# Patient Record
Sex: Male | Born: 1955 | Race: Black or African American | Hispanic: No
Health system: Southern US, Community
[De-identification: ages and names within clinical notes are randomized; demographics above are authoritative.]

## PROBLEM LIST (undated history)

## (undated) DIAGNOSIS — C801 Malignant (primary) neoplasm, unspecified: Secondary | ICD-10-CM

---

## 1998-11-02 ENCOUNTER — Ambulatory Visit (HOSPITAL_COMMUNITY): Admission: RE | Admit: 1998-11-02 | Discharge: 1998-11-02 | Payer: Self-pay | Admitting: Family Medicine

## 1998-11-02 ENCOUNTER — Encounter: Payer: Self-pay | Admitting: Family Medicine

## 2016-05-02 ENCOUNTER — Inpatient Hospital Stay (HOSPITAL_COMMUNITY)
Admission: EM | Admit: 2016-05-02 | Discharge: 2016-05-04 | DRG: 871 | Disposition: A | Discharge status: Hospice | Payer: Non-veteran care | Attending: Oncology | Admitting: Oncology

## 2016-05-02 ENCOUNTER — Emergency Department (HOSPITAL_COMMUNITY): Payer: Non-veteran care

## 2016-05-02 ENCOUNTER — Encounter (HOSPITAL_COMMUNITY): Payer: Self-pay | Admitting: Emergency Medicine

## 2016-05-02 DIAGNOSIS — Z91018 Allergy to other foods: Secondary | ICD-10-CM

## 2016-05-02 DIAGNOSIS — A419 Sepsis, unspecified organism: Secondary | ICD-10-CM | POA: Diagnosis present

## 2016-05-02 DIAGNOSIS — Z79891 Long term (current) use of opiate analgesic: Secondary | ICD-10-CM | POA: Diagnosis not present

## 2016-05-02 DIAGNOSIS — Z515 Encounter for palliative care: Secondary | ICD-10-CM | POA: Diagnosis present

## 2016-05-02 DIAGNOSIS — C7970 Secondary malignant neoplasm of unspecified adrenal gland: Secondary | ICD-10-CM | POA: Diagnosis present

## 2016-05-02 DIAGNOSIS — E279 Disorder of adrenal gland, unspecified: Secondary | ICD-10-CM | POA: Diagnosis present

## 2016-05-02 DIAGNOSIS — Z9221 Personal history of antineoplastic chemotherapy: Secondary | ICD-10-CM

## 2016-05-02 DIAGNOSIS — G936 Cerebral edema: Secondary | ICD-10-CM | POA: Diagnosis present

## 2016-05-02 DIAGNOSIS — J9621 Acute and chronic respiratory failure with hypoxia: Secondary | ICD-10-CM | POA: Diagnosis present

## 2016-05-02 DIAGNOSIS — C7971 Secondary malignant neoplasm of right adrenal gland: Secondary | ICD-10-CM

## 2016-05-02 DIAGNOSIS — J90 Pleural effusion, not elsewhere classified: Secondary | ICD-10-CM | POA: Diagnosis present

## 2016-05-02 DIAGNOSIS — J9811 Atelectasis: Secondary | ICD-10-CM | POA: Diagnosis present

## 2016-05-02 DIAGNOSIS — C3490 Malignant neoplasm of unspecified part of unspecified bronchus or lung: Secondary | ICD-10-CM

## 2016-05-02 DIAGNOSIS — R402362 Coma scale, best motor response, obeys commands, at arrival to emergency department: Secondary | ICD-10-CM | POA: Diagnosis present

## 2016-05-02 DIAGNOSIS — J189 Pneumonia, unspecified organism: Secondary | ICD-10-CM | POA: Diagnosis present

## 2016-05-02 DIAGNOSIS — G9340 Encephalopathy, unspecified: Secondary | ICD-10-CM | POA: Diagnosis present

## 2016-05-02 DIAGNOSIS — I10 Essential (primary) hypertension: Secondary | ICD-10-CM | POA: Diagnosis present

## 2016-05-02 DIAGNOSIS — R590 Localized enlarged lymph nodes: Secondary | ICD-10-CM

## 2016-05-02 DIAGNOSIS — R400 Somnolence: Secondary | ICD-10-CM

## 2016-05-02 DIAGNOSIS — Z9981 Dependence on supplemental oxygen: Secondary | ICD-10-CM

## 2016-05-02 DIAGNOSIS — C7931 Secondary malignant neoplasm of brain: Secondary | ICD-10-CM | POA: Diagnosis present

## 2016-05-02 DIAGNOSIS — C342 Malignant neoplasm of middle lobe, bronchus or lung: Secondary | ICD-10-CM

## 2016-05-02 DIAGNOSIS — G911 Obstructive hydrocephalus: Secondary | ICD-10-CM | POA: Diagnosis present

## 2016-05-02 DIAGNOSIS — C7972 Secondary malignant neoplasm of left adrenal gland: Secondary | ICD-10-CM

## 2016-05-02 DIAGNOSIS — M545 Low back pain: Secondary | ICD-10-CM | POA: Diagnosis not present

## 2016-05-02 DIAGNOSIS — R402132 Coma scale, eyes open, to sound, at arrival to emergency department: Secondary | ICD-10-CM | POA: Diagnosis present

## 2016-05-02 DIAGNOSIS — E119 Type 2 diabetes mellitus without complications: Secondary | ICD-10-CM

## 2016-05-02 DIAGNOSIS — R402252 Coma scale, best verbal response, oriented, at arrival to emergency department: Secondary | ICD-10-CM | POA: Diagnosis present

## 2016-05-02 DIAGNOSIS — G919 Hydrocephalus, unspecified: Secondary | ICD-10-CM

## 2016-05-02 DIAGNOSIS — D72829 Elevated white blood cell count, unspecified: Secondary | ICD-10-CM

## 2016-05-02 DIAGNOSIS — Z833 Family history of diabetes mellitus: Secondary | ICD-10-CM

## 2016-05-02 DIAGNOSIS — C3411 Malignant neoplasm of upper lobe, right bronchus or lung: Secondary | ICD-10-CM

## 2016-05-02 DIAGNOSIS — J440 Chronic obstructive pulmonary disease with acute lower respiratory infection: Secondary | ICD-10-CM | POA: Diagnosis present

## 2016-05-02 DIAGNOSIS — Z87891 Personal history of nicotine dependence: Secondary | ICD-10-CM | POA: Diagnosis not present

## 2016-05-02 DIAGNOSIS — Z923 Personal history of irradiation: Secondary | ICD-10-CM

## 2016-05-02 DIAGNOSIS — Z79899 Other long term (current) drug therapy: Secondary | ICD-10-CM | POA: Diagnosis not present

## 2016-05-02 DIAGNOSIS — J9620 Acute and chronic respiratory failure, unspecified whether with hypoxia or hypercapnia: Secondary | ICD-10-CM

## 2016-05-02 DIAGNOSIS — R0902 Hypoxemia: Secondary | ICD-10-CM

## 2016-05-02 HISTORY — DX: Malignant (primary) neoplasm, unspecified: C80.1

## 2016-05-02 LAB — COMPREHENSIVE METABOLIC PANEL
ALT: 22 U/L (ref 17–63)
AST: 41 U/L (ref 15–41)
Albumin: 3.8 g/dL (ref 3.5–5.0)
Alkaline Phosphatase: 66 U/L (ref 38–126)
Anion gap: 9 (ref 5–15)
BUN: 9 mg/dL (ref 6–20)
CHLORIDE: 99 mmol/L — AB (ref 101–111)
CO2: 32 mmol/L (ref 22–32)
CREATININE: 0.68 mg/dL (ref 0.61–1.24)
Calcium: 9.5 mg/dL (ref 8.9–10.3)
Glucose, Bld: 142 mg/dL — ABNORMAL HIGH (ref 65–99)
Potassium: 3.7 mmol/L (ref 3.5–5.1)
Sodium: 140 mmol/L (ref 135–145)
Total Bilirubin: 1 mg/dL (ref 0.3–1.2)
Total Protein: 7.7 g/dL (ref 6.5–8.1)

## 2016-05-02 LAB — GLUCOSE, CAPILLARY: Glucose-Capillary: 157 mg/dL — ABNORMAL HIGH (ref 65–99)

## 2016-05-02 LAB — I-STAT CG4 LACTIC ACID, ED
LACTIC ACID, VENOUS: 1.36 mmol/L (ref 0.5–1.9)
LACTIC ACID, VENOUS: 0.89 mmol/L (ref 0.5–1.9)

## 2016-05-02 LAB — CBC WITH DIFFERENTIAL/PLATELET
BASOS PCT: 0 %
Basophils Absolute: 0 10*3/uL (ref 0.0–0.1)
EOS PCT: 0 %
Eosinophils Absolute: 0 10*3/uL (ref 0.0–0.7)
HCT: 36.8 % — ABNORMAL LOW (ref 39.0–52.0)
Hemoglobin: 11.7 g/dL — ABNORMAL LOW (ref 13.0–17.0)
LYMPHS ABS: 1.4 10*3/uL (ref 0.7–4.0)
Lymphocytes Relative: 13 %
MCH: 22 pg — AB (ref 26.0–34.0)
MCHC: 31.8 g/dL (ref 30.0–36.0)
MCV: 69 fL — AB (ref 78.0–100.0)
Monocytes Absolute: 0.7 10*3/uL (ref 0.1–1.0)
Monocytes Relative: 6 %
NEUTROS ABS: 8.9 10*3/uL — AB (ref 1.7–7.7)
Neutrophils Relative %: 81 %
PLATELETS: 267 10*3/uL (ref 150–400)
RBC: 5.33 MIL/uL (ref 4.22–5.81)
RDW: 21.2 % — AB (ref 11.5–15.5)
WBC: 11 10*3/uL — ABNORMAL HIGH (ref 4.0–10.5)

## 2016-05-02 LAB — I-STAT TROPONIN, ED
Troponin i, poc: 0.02 ng/mL (ref 0.00–0.08)
Troponin i, poc: 0.03 ng/mL (ref 0.00–0.08)

## 2016-05-02 LAB — PROTIME-INR
INR: 1.11
Prothrombin Time: 14.4 seconds (ref 11.4–15.2)

## 2016-05-02 LAB — BRAIN NATRIURETIC PEPTIDE: B NATRIURETIC PEPTIDE 5: 320.9 pg/mL — AB (ref 0.0–100.0)

## 2016-05-02 LAB — LIPASE, BLOOD: LIPASE: 12 U/L (ref 11–51)

## 2016-05-02 MED ORDER — SENNOSIDES-DOCUSATE SODIUM 8.6-50 MG PO TABS
1.0000 | ORAL_TABLET | Freq: Every day | ORAL | Status: DC
  Administered 2016-05-02: 1 via ORAL
  Filled 2016-05-02: qty 1

## 2016-05-02 MED ORDER — ACETAMINOPHEN 325 MG PO TABS
650.0000 mg | ORAL_TABLET | Freq: Four times a day (QID) | ORAL | Status: DC | PRN
  Filled 2016-05-02: qty 2

## 2016-05-02 MED ORDER — ALBUTEROL SULFATE (2.5 MG/3ML) 0.083% IN NEBU: 2.5000 mg | INHALATION_SOLUTION | RESPIRATORY_TRACT | Status: DC | PRN

## 2016-05-02 MED ORDER — MORPHINE SULFATE (PF) 4 MG/ML IV SOLN
4.0000 mg | INTRAVENOUS | Status: DC | PRN
  Administered 2016-05-03 – 2016-05-04 (×4): 4 mg via INTRAVENOUS
  Filled 2016-05-02 (×4): qty 1

## 2016-05-02 MED ORDER — SODIUM CHLORIDE 0.9% FLUSH
3.0000 mL | Freq: Two times a day (BID) | INTRAVENOUS | Status: DC
  Administered 2016-05-02 – 2016-05-04 (×4): 3 mL via INTRAVENOUS

## 2016-05-02 MED ORDER — CEFEPIME HCL 2 G IJ SOLR
2.0000 g | Freq: Once | INTRAMUSCULAR | Status: AC
  Administered 2016-05-02: 2 g via INTRAVENOUS
  Filled 2016-05-02: qty 2

## 2016-05-02 MED ORDER — SODIUM CHLORIDE 0.9% FLUSH
3.0000 mL | Freq: Two times a day (BID) | INTRAVENOUS | Status: DC
  Administered 2016-05-02 – 2016-05-04 (×3): 3 mL via INTRAVENOUS

## 2016-05-02 MED ORDER — PREDNISONE 50 MG PO TABS
60.0000 mg | ORAL_TABLET | Freq: Every day | ORAL | Status: DC
  Administered 2016-05-03: 60 mg via ORAL
  Filled 2016-05-02: qty 1

## 2016-05-02 MED ORDER — ACETAMINOPHEN 650 MG RE SUPP: 650.0000 mg | Freq: Four times a day (QID) | RECTAL | Status: DC | PRN

## 2016-05-02 MED ORDER — VANCOMYCIN HCL 500 MG IV SOLR
500.0000 mg | INTRAVENOUS | Status: AC
  Administered 2016-05-02: 500 mg via INTRAVENOUS
  Filled 2016-05-02: qty 500

## 2016-05-02 MED ORDER — IOPAMIDOL (ISOVUE-370) INJECTION 76%
INTRAVENOUS | Status: AC
  Administered 2016-05-02: 100 mL
  Filled 2016-05-02: qty 100

## 2016-05-02 MED ORDER — SODIUM CHLORIDE 0.9 % IV SOLN: 250.0000 mL | INTRAVENOUS | Status: DC | PRN

## 2016-05-02 MED ORDER — MAGNESIUM SULFATE 2 GM/50ML IV SOLN
2.0000 g | Freq: Once | INTRAVENOUS | Status: AC
  Administered 2016-05-02: 2 g via INTRAVENOUS
  Filled 2016-05-02: qty 50

## 2016-05-02 MED ORDER — VANCOMYCIN HCL IN DEXTROSE 1-5 GM/200ML-% IV SOLN
1000.0000 mg | Freq: Once | INTRAVENOUS | Status: AC
  Administered 2016-05-02: 1000 mg via INTRAVENOUS
  Filled 2016-05-02: qty 200

## 2016-05-02 MED ORDER — IPRATROPIUM-ALBUTEROL 0.5-2.5 (3) MG/3ML IN SOLN
3.0000 mL | Freq: Once | RESPIRATORY_TRACT | Status: AC
  Administered 2016-05-02: 3 mL via RESPIRATORY_TRACT
  Filled 2016-05-02: qty 3

## 2016-05-02 MED ORDER — DEXTROSE 5 % IV SOLN
1.0000 g | Freq: Three times a day (TID) | INTRAVENOUS | Status: DC
  Filled 2016-05-02: qty 1

## 2016-05-02 MED ORDER — METHYLPREDNISOLONE SODIUM SUCC 125 MG IJ SOLR
60.0000 mg | Freq: Once | INTRAMUSCULAR | Status: AC
  Administered 2016-05-02: 60 mg via INTRAVENOUS
  Filled 2016-05-02: qty 2

## 2016-05-02 MED ORDER — DEXTROSE 5 % IV SOLN
1.0000 g | Freq: Three times a day (TID) | INTRAVENOUS | Status: DC
  Administered 2016-05-02 – 2016-05-04 (×5): 1 g via INTRAVENOUS
  Filled 2016-05-02 (×7): qty 1

## 2016-05-02 MED ORDER — SODIUM CHLORIDE 0.9 % IV BOLUS (SEPSIS)
1000.0000 mL | Freq: Once | INTRAVENOUS | Status: AC
  Administered 2016-05-02: 1000 mL via INTRAVENOUS

## 2016-05-02 MED ORDER — VANCOMYCIN HCL IN DEXTROSE 1-5 GM/200ML-% IV SOLN
1000.0000 mg | Freq: Three times a day (TID) | INTRAVENOUS | Status: DC
  Filled 2016-05-02 (×2): qty 200

## 2016-05-02 MED ORDER — INSULIN ASPART 100 UNIT/ML ~~LOC~~ SOLN
0.0000 [IU] | Freq: Three times a day (TID) | SUBCUTANEOUS | Status: DC
  Administered 2016-05-03: 1 [IU] via SUBCUTANEOUS
  Administered 2016-05-03 (×2): 2 [IU] via SUBCUTANEOUS
  Administered 2016-05-04 (×2): 1 [IU] via SUBCUTANEOUS

## 2016-05-02 MED ORDER — ONDANSETRON HCL 4 MG/2ML IJ SOLN
4.0000 mg | Freq: Four times a day (QID) | INTRAMUSCULAR | Status: DC | PRN
  Administered 2016-05-03 – 2016-05-04 (×2): 4 mg via INTRAVENOUS
  Filled 2016-05-02 (×2): qty 2

## 2016-05-02 MED ORDER — ONDANSETRON HCL 4 MG PO TABS
4.0000 mg | ORAL_TABLET | Freq: Four times a day (QID) | ORAL | Status: DC | PRN
  Administered 2016-05-02: 4 mg via ORAL
  Filled 2016-05-02: qty 1

## 2016-05-02 MED ORDER — SODIUM CHLORIDE 0.9% FLUSH: 3.0000 mL | INTRAVENOUS | Status: DC | PRN

## 2016-05-02 MED ORDER — IPRATROPIUM-ALBUTEROL 0.5-2.5 (3) MG/3ML IN SOLN
3.0000 mL | RESPIRATORY_TRACT | Status: DC
  Administered 2016-05-02: 3 mL via RESPIRATORY_TRACT
  Filled 2016-05-02: qty 3

## 2016-05-02 NOTE — ED Notes (Signed)
[facility] contacted to activate Code Sepsis 

## 2016-05-02 NOTE — H&P (Signed)
Date: 05/02/2016               Patient Name:  Pedro Long MRN: [id number]  DOB: 02-Feb-1956 Age / Sex: 61 y.o., male   PCP: No primary care provider on file.         Medical Service: Internal Medicine Teaching Service         Attending Physician: Dr. Annia Belt, MD    First Contact: Dr. Jari Favre Pager: [phone]  Second Contact: Dr. Charlynn Grimes Pager: [phone]       After Hours (After 5p/  First Contact Pager: [phone]  weekends / holidays): Second Contact Pager: [phone]   Chief Complaint: shortness of breath, altered mental status.  History of Present Illness:  Pedro Long is a 61yo male with PMH of T2DM, HTN, COPD, small cell lung cancer with brain metastasis on chronic 4L [place] oxygen, presenting to the [facility] for shortness of breath and altered mental status.   Patient is unable to participate in meaningful interview at this time, so bulk of history was obtained from wife and son. Patient states he has no current complaints.  Family states that they noticed changes in mentation 2 days prior to admission, with confusion, somnolence, and unstable gait. Night prior to admission, he had developed subjective fevers and progressive increased work of breathing. He has a productive cough and chest pain with cough. He has had no vomiting, diarrhea, rashes, or other symptoms they have noticed, though EDP notes states he reported nausea and vomiting.   In the ED, patient was given duonebs, mag sulfate, solumedrol and was placed on bipap with symptomatic improvement. Prior to BiPAP he was on NRB saturating in mid 11s - mid 90s.  Patient is followed by the [facility]. Patient was recently diagnosed with stage 4 small cell lung cancer; he had a trial of chemotherapy, radiotherapy without improvement and with side effects so further therapy was held. Family is working on getting hospice at home. Wife and brother are decision makers. Wife is present in room; she states patient's goal is to go home with  hospice and she wants that for him which is why they are working on setting it up. However, she maintains Full Code. Per rest of family, patient has been back and forth on his code status but tends to want full code when he is acutely ill.   Meds:  Family did not bring in medicines or medicine list so unable to confirm. Lisinopril Morphine Oxycodone Anti-anxiety medicine Antiemetic   Allergies: Allergies as of 05/02/2016 - Review Complete 05/02/2016  Allergen Reaction Noted  . Pork-derived products  05/02/2016   Past Medical History:  Diagnosis Date  . Cancer [facility])     Family History: Family history of diabetes  Social History: Former smoker, previous heavy alcohol use, no illicit drug use.   Review of Systems: A complete ROS was negative except as per HPI.   Physical Exam: Blood pressure 162/93, pulse 108, resp. rate 26, height '5\' 11"'$  (1.803 m), weight 190 lb (86.2 kg), SpO2 97 %. General: alert, well-developed, and cooperative to examination.  Head: normocephalic and atraumatic.  Eyes: vision grossly intact, pupils equal, pupils round, pupils reactive to light, no injection and anicteric.  Mouth: unable to evaluate due to BiPAP.  Neck: supple, full ROM, no thyromegaly.  Lungs: normal respiratory effort, no accessory muscle use, course breath sounds throughout, no wheezing. Heart: normal rate, regular rhythm, no murmur, no gallop, and no rub.  Abdomen: soft,  non-tender, normal bowel sounds, no distention, no guarding, no rebound tenderness.  Msk: no joint swelling, no joint warmth, and no redness over joints.  Pulses: 2+ DP/PT pulses bilaterally Extremities: No cyanosis, clubbing, minimal LE edema Neurologic: alert & oriented X2, moves all extremities freely, follows some commands CN 2-12 grossly intact, strength normal in all extremities, sensation intact to light touch.  Skin: turgor normal and no rashes.  Psych: Oriented X2, not anxious appearing, and not depressed  appearing  LABS: WBC 11.0, Hgb 11.7, Hct 36.8, Plts 267 Na 140, K 3.7, Cl 99, CO2 32, BUN 9, Cr 0.68, Glu 142 LFT's unremarkable Lipase 12 BNP 320 PT/INR 14.4/1.106 iStat trop negative x2 LA neg x2  EKG: Personally reviewed - SR, biphasic t waves in V1-V2  CXR: Personally reviewed - severe right middle and lower lobe consolidation  CTA: No PE Advanced stage IV lung cancer extending into the mediastinum, encroachment of right main and right upper lobe pulm arteries. Occlusion of right middle lobe bronchi with subsequent consolidation. Mediastinal adenopathy, enlarged left supraclavicular lymph nodes, a left upper lobe nodule and left adrenal mass.   CT head: Mild to moderate obstructive hydrocephalus. Symmetric cerebellar edema.   Assessment & Plan by Problem: Principal Problem:   Sepsis ([place]) Active Problems:   Small cell lung cancer in adult [facility])   Brain metastases ([place])  Sepsis:  Patient with tachycardia, tachypnea, leukocytosis and encephalopathy in setting of possible pneumonia. Patient was started on vanc/cefepime empirically and received 1086m NS. --f/u BCx --f/u UA --f/u flu panel --f/u AM CBC  Encephalopathy: Patient with confusion, unstable gait and increased somnolence in last 48hrs. CT head showed mild to moderate hydrocephalus and cerebellar edema which could certainly be contributing to his encephalopathy. Other etiology likely is in setting of sepsis and infection.  --treatment of sepsis and pneumonia as below --solumedrol  Pneumonia: Patient symptoms and imaging concerning for pneumonia though hard to distinguish from large tumor burden.  --Cefepime  Acute on chronic respiratory failure: Patient with chronic oxygen requirement of 4L [place] at home in setting of advanced lung cancer and COPD. He had improvement symptomatically with BiPAP, solumedrol, mag sulfate and duonebs. --supplemental oxygen as needed; on home 4L [place]; BiPAP qhs and as  needed --solumedrol  --duonebs; albuterol PRN  Stage 4 small cell lung cancer, goals of care: Patient with advanced tumor burden now encroaching on pulmonary arteries. He has failed chemo radiation and goals are for home hospice. Patient is apparently undecided on code status; at this time he is altered so cannot reliably participate in such a conversation, though he did shake his head no to intubation. Wife and brother are decision makers. Wife is present in room and maintains FULL CODE status though acknowledging that she would eventually want patient to be at home with hospice. We discussed at length the low likelyhood of surviving code situation and if he did the low likelyhood of extubation and return home. We also discussed the repercussions of aggressive intervention especially in setting of unfavorable outcome. Family is in agreement with speaking with palliative care to help arrange and coordinate home hospice when the time is right (they already have some set up but per family is inadequate to completely take care of his needs). Family does not know pain medicine dosing at home; at admission patient seemed very comfortable and was not complaining of pain, in addition was not having air hunger at the time. --consult palliative care for hospice options and coordination, continued goals of  care discussion, and symptomatic treatment --we will also continue the discussion about goals of care with family --mophine '4mg'$  q4hr PRN; will adjust as necessary --senokot-s qhs --zofran prn  T2DM: Patient not on home therapy. --SSI-S; this may change as we continue discussion with family about goals of care  HTN: Patient possibly on lisinopril at home. In setting of sepsis, we are holding off on antihypertensives at this time.  Diet: Regular IVF: none DVT: SCDs Code: FULL Code  Dispo: Admit patient to Inpatient with expected length of stay greater than 2 midnights.  Signed: Alphonzo Grieve,  MD 05/02/2016, 7:06 PM  Pager [phone]

## 2016-05-02 NOTE — Progress Notes (Signed)
Per admitting MD in room- take pt off bipap and place on [place] oxygen.  Pt appears to be tol well currently, no distress noted. VSS, sat 97% on 6 lpm [place].  Pt is awake and talking/making jokes with his family at bedside.

## 2016-05-02 NOTE — Progress Notes (Signed)
Pt placed on bipap per MD order.  Pt appears to be tol well currently, no distress noted currently on bipap.  VSS, sat 96%

## 2016-05-02 NOTE — ED Notes (Signed)
Attempted report x1. 

## 2016-05-02 NOTE — Progress Notes (Signed)
Patient stable on 6L at this time, with minimal distress noted, patient states that his breathing is better than when he first got here. BiPAP at bedside for QHS use. RT will continue to monitor.

## 2016-05-02 NOTE — ED Notes (Signed)
Admitting at the bedside.  

## 2016-05-02 NOTE — Progress Notes (Signed)
Pt taken to/from CT on bipap w/ no apparent complications.  Family requested a "medium" mask for pt as we were transporting to CT.  Upon return from CT, I asked pt if current large mask was comfortable for him, pt says "yes".  I asked him if he wants to switch to "medium" mask- pt states he "is fine now".  Leak approx 8-20 w/ good VT= 700-800, rr 16-18, no distress noted, VSS currently.  RN aware.

## 2016-05-02 NOTE — Progress Notes (Signed)
Pharmacy Antibiotic Note  Pedro Long is a 61 y.o. male admitted on 05/02/2016 with code sepsis.  Pharmacy has been consulted for vancomycin and cefepime dosing.  Baseline labs reviewed and first doses of antibiotics are already ordered.  Plan: - Vanc '500mg'$  IV x 1 for a total of '1500mg'$  load, then 1gm IV Q8H - Cefepime 2gm IV x 1 as already ordered, then 1gm IV Q8H - Monitor renal fxn, clinical progress, vanc trough at [facility]   Height: '5\' 11"'$  (180.3 cm) Weight: 190 lb (86.2 kg) IBW/kg (Calculated) : 75.3  No data recorded.   Recent Labs Lab 05/02/16 1338 05/02/16 1400  WBC 11.0*  --   CREATININE 0.68  --   LATICACIDVEN  --  1.36    Estimated Creatinine Clearance: 102 mL/min (by C-G formula based on SCr of 0.68 mg/dL).    Allergies  Allergen Reactions  . Pork-Derived Products     Vanc 2/24 >> Cefepime 2/24 >>  2/24 BCx x2 -    Ramsey Guadamuz D. Mina Marble, PharmD, BCPS Pager:  [phone] 05/02/2016, 3:02 PM

## 2016-05-02 NOTE — ED Provider Notes (Signed)
[facility] DEPT Provider Note   CSN: [id number] Arrival date & time: 05/02/16  1238     History   Chief Complaint Chief Complaint  Patient presents with  . Respiratory Distress    HPI Pedro Long is a 61 y.o. male a past medical history significant for lung cancer with metastasis to the brain  managed at the [place] and currently on 4 L nasal cannula at baseline who presents with altered mental status, chills, cough, chest pain, shortness of breath, and fatigue. Patient is currently by family who report that for the last 48 hours, patient has had a severe decline in his health. The patient is acting much more sleepy however he is arousable to voice and conversation. Patient is not having any headache or vision changes. Patient has however developed a cough with a production. Patient is describing shortness of breath and chest pain. Patient has wheezing and shortness of breath. In route, EMS gave the patient albuterol treatment and Solu-Medrol. Patient reports having a sharp chest pain when he is coughing and breathing fast. Patient reports nausea and vomiting as well as some abdominal aching. He denies changes in his bowel or bladder habits. Patient denies any other complains.  Family reports that although the patient receives care at the [place], they felt he was too sick to make their today. They have not had formal end-of-life discussions yet and after discussion with the son, they continue to be full code.    HPI  Past Medical History:  Diagnosis Date  . Cancer [facility])     Patient Active Problem List   Diagnosis Date Noted  . Sepsis ([place]) 05/02/2016  . 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 Brain metastases ([place]) 05/02/2016    No past surgical history on file.     Home Medications    Prior to Admission medications   Not on File    Family History No family history on file.  Social History Social History  Substance Use Topics  . Smoking status: Not on  file  . Smokeless tobacco: Not on file  . Alcohol use Not on file     Allergies   Patient has no allergy information on record.   Review of Systems Review of Systems  Constitutional: Positive for chills, diaphoresis, fatigue and fever.  HENT: Positive for congestion and rhinorrhea.   Respiratory: Positive for choking, chest tightness, shortness of breath and wheezing. Negative for stridor.   Cardiovascular: Positive for chest pain and leg swelling. Negative for palpitations.  Gastrointestinal: Positive for abdominal pain, nausea and vomiting. Negative for diarrhea.  Genitourinary: Negative for flank pain.  Musculoskeletal: Negative for back pain, neck pain and neck stiffness.  Skin: Negative for wound.  Neurological: Negative for light-headedness, numbness and headaches.  Psychiatric/Behavioral: Negative for agitation.     Physical Exam Updated Vital Signs BP (!) 192/105 (BP Location: Right Arm)   Pulse (!) 121   Resp 22   SpO2 99%   Physical Exam  Constitutional: He appears well-developed and well-nourished. No distress.  HENT:  Head: Normocephalic and atraumatic.  Right Ear: External ear normal.  Left Ear: External ear normal.  Nose: Nose normal.  Mouth/Throat: Oropharynx is clear and moist. No oropharyngeal exudate.  Eyes: Conjunctivae and EOM are normal. Pupils are equal, round, and reactive to light.  Neck: Normal range of motion. Neck supple.  Cardiovascular: Tachycardia present.   Pulmonary/Chest: No stridor. Tachypnea noted. No respiratory distress. He has decreased breath sounds. He has  wheezes. He has rhonchi. He exhibits no tenderness.  Abdominal: Soft. There is no tenderness. There is no rebound and no guarding.  Musculoskeletal: He exhibits no edema.  Neurological: He is alert. He is not disoriented. He displays no tremor and normal reflexes. No cranial nerve deficit or sensory deficit. He exhibits normal muscle tone. Coordination normal. GCS eye subscore is  3. GCS verbal subscore is 5. GCS motor subscore is 6.  Skin: Skin is warm. Capillary refill takes less than 2 seconds. No rash noted. He is not diaphoretic. No erythema. No pallor.     ED Treatments / Results  Labs (all labs ordered are listed, but only abnormal results are displayed) Labs Reviewed  CBC WITH DIFFERENTIAL/PLATELET - Abnormal; Notable for the following:       Result Value   WBC 11.0 (*)    Hemoglobin 11.7 (*)    HCT 36.8 (*)    MCV 69.0 (*)    MCH 22.0 (*)    RDW 21.2 (*)    Neutro Abs 8.9 (*)    All other components within normal limits  COMPREHENSIVE METABOLIC PANEL - Abnormal; Notable for the following:    Chloride 99 (*)    Glucose, Bld 142 (*)    All other components within normal limits  BRAIN NATRIURETIC PEPTIDE - Abnormal; Notable for the following:    B Natriuretic Peptide 320.9 (*)    All other components within normal limits  CULTURE, BLOOD (ROUTINE X 2)  CULTURE, BLOOD (ROUTINE X 2)  LIPASE, BLOOD  PROTIME-INR  URINALYSIS, ROUTINE W REFLEX MICROSCOPIC  I-STAT CG4 LACTIC ACID, ED  I-STAT TROPOININ, ED  I-STAT CG4 LACTIC ACID, ED  Randolm Idol, ED    EKG  EKG Interpretation  Date/Time:  Saturday May 02 2016 12:44:33 EST Ventricular Rate:  121 PR Interval:    QRS Duration: 84 QT Interval:  316 QTC Calculation: 449 R Axis:   -62 Text Interpretation:  Sinus tachycardia Consider right ventricular hypertrophy LVH by voltage Inferior infarct, old No STEMI Confirmed by TEGELER MD, CHRISTOPHER [phone]) on 05/02/2016 4:25:15 PM       Radiology Ct Head Wo Contrast  Result Date: 05/02/2016 CLINICAL DATA:  Altered mental status. Respiratory distress. History of metastatic lung carcinoma. EXAM: CT HEAD WITHOUT CONTRAST TECHNIQUE: Contiguous axial images were obtained from the base of the skull through the vertex without intravenous contrast. COMPARISON:  None. FINDINGS: Brain: No evidence of intracranial hemorrhage, abnormal extra axial fluid  collections, or midline shift. Moderate dilatation of the lateral and third ventricles is seen. The fourth ventricle is small in size, and symmetric decreased attenuation within the cerebellum bilaterally is suspicious for edema. This is of uncertain etiology, underlying metastatic disease cannot be excluded. Vascular: No hyperdense vessel or unexpected calcification. Skull: Normal. Negative for fracture or focal lesion. Sinuses/Orbits: No acute finding. Other: None. 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 Symmetric bilateral cerebellar edema, which is of uncertain etiology. Underlying metastatic disease cannot be excluded. Recommend brain MRI without and with contrast for further evaluation. Electronically Signed   By: Earle Gell M.D.   On: 05/02/2016 17:01   Ct Angio Chest Pe W And/or Wo Contrast  Result Date: 05/02/2016 CLINICAL DATA:  Respiratory distress. History of lung carcinoma with metastatic disease to the brain. Hypoxia. EXAM: CT ANGIOGRAPHY CHEST WITH CONTRAST TECHNIQUE: Multidetector CT imaging of the chest was performed using the standard protocol during bolus administration of intravenous contrast. Multiplanar CT image reconstructions and MIPs were obtained to evaluate the  vascular anatomy. CONTRAST:  100 mL of Isovue 370 intravenous contrast COMPARISON:  Current chest radiograph. FINDINGS: Cardiovascular: Satisfactory opacification of the pulmonary arteries to the segmental level. No evidence of pulmonary embolism. There is mild narrowing of the right main pulmonary artery and more significant narrowing of the right upper lobe pulmonary artery due to tumor encasing the right hilum. The aorta is normal in caliber. No dissection. Mild partly calcified atherosclerotic plaque is noted along the thoracic aorta and its branch vessels. The heart is mildly enlarged. There are mild coronary artery calcifications. Mediastinum/Nodes: Abnormal soft tissue consistent with tumor surrounds the  right hilar structures extends along the right peritracheal mediastinum and subcarinal mediastinum, as well as contacting and partly surrounding the left mainstem bronchus. The right distal mainstem bronchi is a significantly narrowed, with significant narrowing of the bronchus intermedius. The upper lobe bronchus appears occluded. There is abnormal soft tissue consistent with confluent metastatic lymph nodes in the anterior mediastinum, with a maximum transverse dimensions of 5.1 x 3.6 cm. There are enlarged left neck base lymph nodes, the largest measuring 1.9 cm in short axis. Lungs/Pleura: There is complete opacification of the right upper lobe, with complete atelectasis of the right middle lobe with partial atelectasis of the right lower lobe. There is a small right pleural effusion. There is partial atelectasis of the left lower lobe. 4 mm nodules noted in left upper lobe, image 27, series 41. There is no evidence of pulmonary edema. No pneumothorax. Upper Abdomen: There is an irregular left adrenal mass, incompletely imaged, measuring 3.4 x 2.2 cm transversely. No acute findings in the upper abdomen. Musculoskeletal: No osteoblastic or osteolytic lesions. Review of the MIP images confirms the above findings. IMPRESSION: 1. No evidence of a pulmonary embolism. 2. Advanced, stage IV lung carcinoma with tumor surrounding the right hilar structures extending into the mediastinum. Tumor occludes the right upper lobe right middle lobe bronchi with consolidation of the right upper lobe and atelectasis of the right middle lobe. There is partial atelectasis of the right lower lobe. Tumor narrows the right main pulmonary artery g causes significant narrowing of the right upper lobe pulmonary artery. 3. In addition to metastatic mediastinal adenopathy, there are enlarged left supraclavicular lymph nodes, small left upper lobe lung nodule, and a left adrenal mass. Electronically Signed   By: Lajean Manes M.D.   On:  05/02/2016 17:04   Dg Chest Portable 1 View  Result Date: 05/02/2016 CLINICAL DATA:  61 year old male with metastatic lung cancer, cough, chills and hypoxia EXAM: PORTABLE CHEST 1 VIEW COMPARISON:  None. FINDINGS: Extensive airspace opacity in the right lower lobe obscuring the cardiac border and diaphragm. Overall inspiratory volumes are low. There is mild patchy airspace opacity in the left lower lobe is well. Cardiac silhouette is partially obscured by the right-sided pulmonary process but appears to be within normal limits. No pneumothorax. Suspect a layering pleural effusion on the right as well. No acute osseous abnormality. IMPRESSION: Significant right middle and lower lobe airspace opacity. With a reported history of lung cancer, differential considerations include malignancy, pneumonia, and pleural effusion with associated atelectasis or some combination of the above. Mild patchy airspace opacity in the left lower lobe may reflect atelectasis or infiltrate. Next item Low inspiratory volumes. Electronically Signed   By: Jacqulynn Cadet M.D.   On: 05/02/2016 13:46    Procedures Procedures (including critical care time)  CRITICAL CARE Performed by: Gwenyth Allegra Tegeler Total critical care time: 45 minutes Critical care time was  exclusive of separately billable procedures and treating other patients. Critical care was necessary to treat or prevent imminent or life-threatening deterioration. Critical care was time spent personally by me on the following activities: development of treatment plan with patient and/or surrogate as well as nursing, discussions with consultants, evaluation of patient's response to treatment, examination of patient, obtaining history from patient or surrogate, ordering and performing treatments and interventions, ordering and review of laboratory studies, ordering and review of radiographic studies, pulse oximetry and re-evaluation of patient's  condition.   Medications Ordered in ED Medications  ceFEPIme (MAXIPIME) 2 g in dextrose 5 % 50 mL IVPB (2 g Intravenous New Bag/Given 05/02/16 1621)  vancomycin (VANCOCIN) 500 mg in sodium chloride 0.9 % 100 mL IVPB (500 mg Intravenous New Bag/Given 05/02/16 1620)  vancomycin (VANCOCIN) IVPB 1000 mg/200 mL premix (not administered)  ceFEPIme (MAXIPIME) 1 g in dextrose 5 % 50 mL IVPB (not administered)  sodium chloride 0.9 % bolus 1,000 mL (1,000 mLs Intravenous New Bag/Given 05/02/16 1417)  magnesium sulfate IVPB 2 g 50 mL (0 g Intravenous Stopped 05/02/16 1531)  ipratropium-albuterol (DUONEB) 0.5-2.5 (3) MG/3ML nebulizer solution 3 mL (3 mLs Nebulization Given 05/02/16 1302)  vancomycin (VANCOCIN) IVPB 1000 mg/200 mL premix (0 mg Intravenous Stopped 05/02/16 1535)  iopamidol (ISOVUE-370) 76 % injection (100 mLs  Contrast Given 05/02/16 1615)     Initial Impression / Assessment and Plan / ED Course  I have reviewed the triage vital signs and the nursing notes.  Pertinent labs & imaging results that were available during my care of the patient were reviewed by me and considered in my medical decision making (see chart for details).     KHOLTON COATE is a 61 y.o. male a past medical history significant for lung cancer with metastasis to the brain  managed at the [place] and currently on 4 L nasal cannula at baseline who presents with altered mental status, chills, cough, chest pain, shortness of breath, and fatigue.  History and exam are seen above.  On exam, patient has poor air movement and coarse breath sounds in all lung fields. Patient also has wheezing severely on arrival. Patient is to give a, tachycardic, and appears uncomfortable. Patient is receiving a breathing treatment on arrival.  Chest is nontender and abdomen is nontender. Patient had no focal neurologic deficits on my exam.  Given the patient's productive cough, chills, shortness of breath, tachycardia, and lung exam, suspect  pneumonia. Patient made a code sepsis based on vital signs and clinical concern. X-ray shows evidence of pneumonia. White count elevated.  Patient given broad-spectrum antibiotics. Due to lung cancer and his vital signs, patient will also have CT scan to look for pulmonary embolism. CT of the head will be obtained to look for edema or hemorrhagic stroke in the setting of his brain metastasis.  Patient will need admission for further management of his pneumonia and altered mental status.  Patient was maintaining oxygen saturations between the mid 80s and mid 90s on a nonrebreather however, family requested administration of BiPAP as this is more comfortable for the patient and helped his oxygenation. Patient tolerated BiPAP with his mental status.  5:12 PM CT imaging returned showing no evidence of pulmonary embolism but does show his tumor burden and atelectasis.  CT of the head showed mild to moderate hydrocephalus. This likely accounts for his sleepiness.  Patient will be admitted for further management.   Final Clinical Impressions(s) / ED Diagnoses   Final diagnoses:  Hypoxia  Sepsis due to pneumonia ([place])  Sleepiness  Hydrocephalus     Clinical Impression: 1. Hypoxia   2. Sepsis due to pneumonia ([place])   3. Sleepiness   4. Hydrocephalus     Disposition: Admit to Internal Medicine service    Courtney Paris, MD 05/02/16 [phone]

## 2016-05-03 DIAGNOSIS — J189 Pneumonia, unspecified organism: Secondary | ICD-10-CM

## 2016-05-03 DIAGNOSIS — C3490 Malignant neoplasm of unspecified part of unspecified bronchus or lung: Secondary | ICD-10-CM

## 2016-05-03 DIAGNOSIS — C7931 Secondary malignant neoplasm of brain: Secondary | ICD-10-CM

## 2016-05-03 DIAGNOSIS — A419 Sepsis, unspecified organism: Principal | ICD-10-CM

## 2016-05-03 DIAGNOSIS — R0902 Hypoxemia: Secondary | ICD-10-CM

## 2016-05-03 LAB — CBC
HCT: 33.6 % — ABNORMAL LOW (ref 39.0–52.0)
HEMOGLOBIN: 10.9 g/dL — AB (ref 13.0–17.0)
MCH: 22.3 pg — ABNORMAL LOW (ref 26.0–34.0)
MCHC: 32.4 g/dL (ref 30.0–36.0)
MCV: 68.7 fL — ABNORMAL LOW (ref 78.0–100.0)
Platelets: 251 10*3/uL (ref 150–400)
RBC: 4.89 MIL/uL (ref 4.22–5.81)
RDW: 21.5 % — AB (ref 11.5–15.5)
WBC: 8.1 10*3/uL (ref 4.0–10.5)

## 2016-05-03 LAB — MRSA PCR SCREENING: MRSA by PCR: NEGATIVE

## 2016-05-03 LAB — GLUCOSE, CAPILLARY
GLUCOSE-CAPILLARY: 143 mg/dL — AB (ref 65–99)
GLUCOSE-CAPILLARY: 171 mg/dL — AB (ref 65–99)
Glucose-Capillary: 195 mg/dL — ABNORMAL HIGH (ref 65–99)
Glucose-Capillary: 158 mg/dL — ABNORMAL HIGH (ref 65–99)

## 2016-05-03 LAB — INFLUENZA PANEL BY PCR (TYPE A & B)
INFLAPCR: NEGATIVE
INFLBPCR: NEGATIVE

## 2016-05-03 MED ORDER — LORAZEPAM 2 MG/ML IJ SOLN: 0.5000 mg | Freq: Once | INTRAMUSCULAR | Status: DC

## 2016-05-03 MED ORDER — SENNOSIDES-DOCUSATE SODIUM 8.6-50 MG PO TABS
2.0000 | ORAL_TABLET | Freq: Every day | ORAL | Status: DC
  Administered 2016-05-03: 2 via ORAL
  Filled 2016-05-03: qty 2

## 2016-05-03 MED ORDER — IPRATROPIUM-ALBUTEROL 0.5-2.5 (3) MG/3ML IN SOLN
3.0000 mL | RESPIRATORY_TRACT | Status: DC
  Administered 2016-05-03 – 2016-05-04 (×7): 3 mL via RESPIRATORY_TRACT
  Filled 2016-05-03 (×7): qty 3

## 2016-05-03 MED ORDER — DEXAMETHASONE 4 MG PO TABS
4.0000 mg | ORAL_TABLET | Freq: Four times a day (QID) | ORAL | Status: DC
  Administered 2016-05-03 – 2016-05-04 (×6): 4 mg via ORAL
  Filled 2016-05-03 (×9): qty 1

## 2016-05-03 NOTE — Progress Notes (Signed)
   Subjective:  Patient is sitting up eating breakfast this AM. He states he feels a lot better than yesterday with improvement of his breathing. He states he does not remember much about yesterday including the IM team; he states he thinks he was having visual hallucinations last night of different people in his room though he did not seem concerned about them.   He endorses one episode of chest pain that resolved on its own. Otherwise he denies other symptoms.   Objective:  Vital signs in last 24 hours: Vitals:   05/03/16 0719 05/03/16 0744 05/03/16 0800 05/03/16 1008  BP: (!) 166/93  (!) 159/92   Pulse: 100  (!) 102   Resp: (!) 22  17   Temp: 98.4 F (36.9 C)     TempSrc: Oral     SpO2: 99% 98%  98%  Weight:      Height:       Constitutional: NAD, pleasantly confused, appears much improved from admission CV: RRR, systolic ejection murmur, no gallops or rubs; pulses intact, no LE edema Resp: dull to percussion and decreased on RL region; no increased work of breathing, speaking in full sentences, coarse breath sounds throughout Abd: soft, NDNT Neuro: A&O to self, thinks it is 2013, thinks he is in a hospice hospital in [facility]; CN 2-12 intact, strength intact, able to follow commands  Assessment/Plan:  Principal Problem:   Sepsis ([place]) Active Problems:   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 He was flu negative, and appears to be improving well with treatment for COPD and pneumonia. --f/u BCx  Encephalopathy: Resolved; 2/2 sepsis vs hydrocephalus/cerebral edema. Patient appears to be at baseline orientation. He is pleasantly confused this morning, joking with the team and following commands appropriately.  --decadron  Pneumonia: Patient with improving respiratory status; he is now back on his home oxygen of 4L [place].  --cefepime, will switch to orals tomorrow likely pending BCx results. --supplemental oxygen 4L [place]  + additional if SpO2 <88%  Acute on chronic respiratory failure: Patient back on his home oxygen of 4L [place], with improvement of symptoms and exam. We discussed CT chest findings with radiology as there appeared to be an effusion in right lung; most of that area is actually collapsed lung with only a very small effusion; thoracentesis would not provide significant relief due to its small size.  --home O2 4L [place]; additional if SpO2 <88% --BiPAP as needed; CPAP qhs --decadron --duonebs; albuterol PRN  Stage 4 small cell lung cancer, goals of care: Family not in room this AM; however Palliative Care was able to speak to wife this morning. We greatly appreciate their assistance. Patient and family will continue working on outpatient hospice through the [place]. Patient remains FULL Code, though wife states she knows he should be DNR but cannot make that decision right now. As far as pain management, current morphine dosing appears appropriate. Patient has long acting morphine and oxycodone at home. We will discuss with family the possibility of outpatient palliative radiation for obstructive hydrocephalus. --continue discussion with family --morphine '4mg'$  IV q4hrs PRN; will adjust as necessary --senokot-s  --zofran PRN  T2DM: --SSI-S  HTN: Mildly hypertensive; will continue monitoring for re-introduction of home meds.  Dispo: Anticipated discharge in approximately 1-2 day(s).   Alphonzo Grieve, MD 05/03/2016, 11:28 AM Pager [phone]

## 2016-05-03 NOTE — Consult Note (Signed)
Consultation Note Date: 05/03/2016   Patient Name: Pedro Long  DOB: 12-20-1955  MRN: [id number]  Age / Sex: 61 y.o., male  PCP: No primary care provider on file. Referring Physician: Annia Belt, MD  Reason for Consultation: Establishing goals of care, Hospice Evaluation and Psychosocial/spiritual support  HPI/Patient Profile: 61 y.o. 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 Patient states I've been out of it for a month. His wife is at the bedside. His spirits are good today. He is eating his breakfast. He is currently under the care of the [facility] out of [place]. He has hospice care through them as well as equipment in the home, Education officer, museum, nursing, and CNA's.They wish to continue that service. Wife reports that she is working on 24 -7 on-call services through them as well and additional help in the home. We did discuss briefly his CODE STATUS and she states she knows that it would be better for him to be a DO NOT RESUSCITATE but the social worker herself nursing are continuing to work with Pedro Long as this goes forward.   Clinical Assessment and Goals of Care: Patient is alert and oriented however apparently he has been quite confused and recognizes that today he is endorsing back pain that is relieved by morphine. He normally takes oral morphine at home. He recognizes the severity of his disease.  NEXT OF KIN would be his surrogate decision maker and that his his wife, Pedro Long in the event that Pedro Long could not speak for himself. He is alert and oriented today    SUMMARY OF RECOMMENDATIONS   Continue full code for now. Staff at the [facility] continue to work with Pedro Long  as this disease goes forward in terms of his [place] is to return home with VNA coming into the home Pedro Long is not refusing any further chemotherapy or radiation Code Status/Advance Care Planning:  Full code    Symptom Management:   Dyspnea: Continue with morphine IV 4 mg every 4 hours as needed  Pain: Patient is verbalizing back pain that is relieved by morphine continue this at 4 mg every 4 hours as needed. Monitor for need for scheduled dosing  Palliative Prophylaxis:   Aspiration, Bowel Regimen, Delirium Protocol, Eye Care, Oral Care and Turn Reposition  Additional Recommendations (Limitations, Scope, Preferences):  Avoid Hospitalization, No Artificial Feeding, No Blood Transfusions, No Chemotherapy, No Hemodialysis, No Radiation and No Tracheostomy  Psycho-social/Spiritual:   Desire for further Chaplaincy support:no  Additional Recommendations: Grief/Bereavement Support  Prognosis:   < 6 weeks  Discharge Planning: Home with Hospice       Primary Diagnoses: Present on Admission: . Sepsis ([place])   I have reviewed the medical record, interviewed the patient and family, and examined the patient. The following aspects are  pertinent.  Past Medical History:  Diagnosis Date  . Cancer [facility])    Social History   Social History  . Marital status: Legally Separated    Spouse name: N/A  . Number of children: N/A  . Years of education: N/A   Social History Main Topics  . Smoking status: None  . Smokeless tobacco: None  . Alcohol use None  . Drug use: Unknown  . Sexual activity: Not Asked   Other Topics Concern  . None   Social History Narrative  . None   No family history on file. Scheduled Meds: . ceFEPime (MAXIPIME) IV  1 g Intravenous Q8H  . dexamethasone  4 mg Oral QID  . insulin aspart  0-9 Units Subcutaneous TID WC  . ipratropium-albuterol  3 mL Nebulization Q4H while awake  . LORazepam  0.5 mg Intravenous Once  . senna-docusate  1  tablet Oral QHS  . sodium chloride flush  3 mL Intravenous Q12H  . sodium chloride flush  3 mL Intravenous Q12H   Continuous Infusions: PRN Meds:.sodium chloride, acetaminophen **OR** acetaminophen, albuterol, morphine injection, ondansetron **OR** ondansetron (ZOFRAN) IV, sodium chloride flush Medications Prior to Admission:  Prior to Admission medications   Medication Sig Start Date End Date Taking? Authorizing Provider  albuterol (PROVENTIL HFA;VENTOLIN HFA) 108 (90 Base) MCG/ACT inhaler Inhale 1 puff into the lungs daily as needed for shortness of breath.   Yes Historical Provider, MD  HYDROcodone-acetaminophen (NORCO) 10-325 MG tablet Take 1 tablet by mouth every 6 (six) hours as needed for moderate pain.    Yes Historical Provider, MD  lisinopril (PRINIVIL,ZESTRIL) 10 MG tablet Take 5 mg by mouth.   Yes Historical Provider, MD  loratadine (CLARITIN) 10 MG tablet Take 5 mg by mouth daily.   Yes Historical Provider, MD  LORazepam (ATIVAN) 0.5 MG tablet Take 1 tablet by mouth daily with breakfast. Take every day per family 04/20/16  Yes Historical Provider, MD  morphine (KADIAN) 60 MG 24 hr capsule Take 1 capsule by mouth 2 (two) times daily. Taking every day per family   Yes Historical Provider, MD  Multiple Vitamins-Minerals (THERA-M) TABS Take 1 tablet by mouth daily.   Yes Historical Provider, MD  Omega-3 Fatty Acids (FISH OIL) 1000 MG CAPS Take 1 tablet by mouth daily.   Yes Historical Provider, MD  oxyCODONE (OXY IR/ROXICODONE) 5 MG immediate release tablet Take 5 mg by mouth 3 (three) times daily. Taking 3   Yes Historical Provider, MD  senna (SENOKOT) 8.6 MG TABS tablet Take 1 tablet by mouth 2 (two) times daily.   Yes Historical Provider, MD  vitamin B-12 (CYANOCOBALAMIN) 100 MCG tablet Take 1 tablet by mouth daily.   Yes Historical Provider, MD   Allergies  Allergen Reactions  . Pork-Derived Products     unknown   Review of Systems  Constitutional: Positive for activity change,  appetite change and fatigue.  HENT: Negative.   Eyes: Negative.   Respiratory: Positive for cough, chest tightness and shortness of breath.   Gastrointestinal: Negative.   Endocrine: Negative.   Genitourinary: Negative.   Musculoskeletal: Positive for arthralgias and back pain.  Allergic/Immunologic: Negative.   Neurological: Positive for weakness.  Hematological: Bruises/bleeds easily.  Psychiatric/Behavioral: Positive for confusion and sleep disturbance.    Physical Exam  Constitutional: He is oriented to person, place, and time. He appears well-developed and well-nourished.  HENT:  Head: Normocephalic and atraumatic.  Neck: Normal range of motion.  Cardiovascular: Normal rate.   Pulmonary/Chest: Effort normal.  Abdominal: Soft.  Neurological: He is alert and oriented to person, place, and time.  Skin: Skin is warm and dry.  Psychiatric: He has a normal mood and affect.  Nursing note and vitals reviewed.   Vital Signs: BP (!) 159/92 (BP Location: Left Arm)   Pulse (!) 102   Temp 98.4 F (36.9 C) (Oral)   Resp 17   Ht 6' (1.829 m)   Wt 85.9 kg (189 lb 6 oz)   SpO2 98%   BMI 25.68 kg/m  Pain Assessment: 0-10   Pain Score: 2    SpO2: SpO2: 98 % O2 Device:SpO2: 98 % O2 Flow Rate: .O2 Flow Rate (L/min): 5 L/min  IO: Intake/output summary:  Intake/Output Summary (Last 24 hours) at 05/03/16 0949 Last data filed at 05/03/16 0900  Gross per 24 hour  Intake             1980 ml  Output                0 ml  Net             1980 ml    LBM: Last BM Date: 04/30/16 Baseline Weight: Weight: 86.2 kg (190 lb) Most recent weight: Weight: 85.9 kg (189 lb 6 oz)     Palliative Assessment/Data:   Flowsheet Rows   Flowsheet Row Most Recent Value  Intake Tab  Referral Department  Hospitalist  Unit at Time of Referral  Intermediate Care Unit  Palliative Care Primary Diagnosis  Cancer  Date Notified  05/02/16  Palliative Care Type  New Palliative care  Reason for referral   Clarify Goals of Care, Pain  Date of Admission  05/02/16  Date first seen by Palliative Care  05/03/16  # of days Palliative referral response time  1 Day(s)  # of days IP prior to Palliative referral  0  Clinical Assessment  Palliative Performance Scale Score  50%  Pain Max last 24 hours  6  Pain Min Last 24 hours  2  Dyspnea Max Last 24 Hours  0  Dyspnea Min Last 24 hours  0  Nausea Max Last 24 Hours  0  Nausea Min Last 24 Hours  0  Anxiety Max Last 24 Hours  0  Anxiety Min Last 24 Hours  0  Other Max Last 24 Hours  0  Psychosocial & Spiritual Assessment  Social Work Plan of Care  Education on Hospice, Referral to community resources  [place]  Patient/Family meeting held?  Yes  Who was at the meeting?  wife and pt  Palliative Care follow-up planned  No      Time In: 0930 Time Out: 1010 Time Total: 70 min Greater than 50%  of this time was spent counseling and coordinating care related to the above assessment and plan.  Signed by: Dory Horn, NP   Please contact Palliative Medicine Team phone at [phone] for questions and concerns.  For individual provider: See Shea Evans

## 2016-05-04 DIAGNOSIS — J9621 Acute and chronic respiratory failure with hypoxia: Secondary | ICD-10-CM

## 2016-05-04 LAB — GLUCOSE, CAPILLARY
Glucose-Capillary: 124 mg/dL — ABNORMAL HIGH (ref 65–99)
Glucose-Capillary: 123 mg/dL — ABNORMAL HIGH (ref 65–99)

## 2016-05-04 MED ORDER — IPRATROPIUM-ALBUTEROL 0.5-2.5 (3) MG/3ML IN SOLN: 3.0000 mL | Freq: Four times a day (QID) | RESPIRATORY_TRACT | 1 refills | Status: AC | PRN

## 2016-05-04 MED ORDER — ONDANSETRON HCL 4 MG PO TABS: 4.0000 mg | ORAL_TABLET | Freq: Four times a day (QID) | ORAL | 0 refills | Status: AC | PRN

## 2016-05-04 MED ORDER — AZITHROMYCIN 250 MG PO TABS: 250.0000 mg | ORAL_TABLET | Freq: Every day | ORAL | 0 refills | Status: AC

## 2016-05-04 MED ORDER — DEXAMETHASONE 4 MG PO TABS: 4.0000 mg | ORAL_TABLET | Freq: Two times a day (BID) | ORAL | 0 refills | Status: AC

## 2016-05-04 NOTE — Discharge Summary (Signed)
Name: KENDREW PACI MRN: [id number] DOB: 1955/12/04 61 y.o. PCP: No primary care provider on file.  Date of Admission: 05/02/2016 12:38 PM Date of Discharge: 05/04/2016 Attending Physician: Annia Belt, MD  Discharge Diagnosis: 1. Acute on chronic respiratory failure 2. Small cell lung cancer with brain metastases Principal Problem:   Sepsis ([place]) Active Problems:   Small cell lung cancer in adult [facility])   Brain metastases ([place])   Hypoxia  Discharge Medications: Allergies as of 05/04/2016      Reactions   Pork-derived Products    unknown      Medication List    TAKE these medications   albuterol 108 (90 Base) MCG/ACT inhaler Commonly known as:  PROVENTIL HFA;VENTOLIN HFA Inhale 1 puff into the lungs daily as needed for shortness of breath.   azithromycin 250 MG tablet Commonly known as:  ZITHROMAX Take 1 tablet (250 mg total) by mouth daily.   dexamethasone 4 MG tablet Commonly known as:  DECADRON Take 1 tablet (4 mg total) by mouth 2 (two) times daily.   Fish Oil 1000 MG Caps Take 1 tablet by mouth daily.   HYDROcodone-acetaminophen 10-325 MG tablet Commonly known as:  NORCO Take 1 tablet by mouth every 6 (six) hours as needed for moderate pain.   ipratropium-albuterol 0.5-2.5 (3) MG/3ML Soln Commonly known as:  DUONEB Take 3 mLs by nebulization every 6 (six) hours as needed.   lisinopril 10 MG tablet Commonly known as:  PRINIVIL,ZESTRIL Take 5 mg by mouth.   loratadine 10 MG tablet Commonly known as:  CLARITIN Take 5 mg by mouth daily.   LORazepam 0.5 MG tablet Commonly known as:  ATIVAN Take 1 tablet by mouth daily with breakfast. Take every day per family   morphine 60 MG 24 hr capsule Commonly known as:  KADIAN Take 1 capsule by mouth 2 (two) times daily. Taking every day per family   ondansetron 4 MG tablet Commonly known as:  ZOFRAN Take 1 tablet (4 mg total) by mouth every 6 (six) hours as needed for nausea.   oxyCODONE 5 MG  immediate release tablet Commonly known as:  Oxy IR/ROXICODONE Take 5 mg by mouth 3 (three) times daily. Taking 3   senna 8.6 MG Tabs tablet Commonly known as:  SENOKOT Take 1 tablet by mouth 2 (two) times daily.   THERA-M Tabs Take 1 tablet by mouth daily.   vitamin B-12 100 MCG tablet Commonly known as:  CYANOCOBALAMIN Take 1 tablet by mouth daily.       Disposition and follow-up:   Mr.Satoru E Buras was discharged from [facility] in Stable condition.  At the hospital follow up visit please address:  1.   Acute on chronic respiratory failure: --patient discharged on azithromycin (end 3/1), decadron as below  --chronically on 4L  --has he had increased shortness of breath or required more oxygen  Small cell lung cancer with brain metastases: --patient was discharged on decadron '4mg'$  BID for cerebellar edema and to cover COPD exacerbation --patient discharged home with hospice; [place] contacted about adding in home sitter --continue addressing goals of care with family   2.  Labs / imaging needed at time of follow-up: based on goals of care if patient were to not be under hospice care in future, may consider repeating CT/MRI head to assess hydrocephalus/edema for palliative treatment  3.  Pending labs/ test needing follow-up: blood culture  Follow-up Appointments: [place] Follow  up.   Why:  [place] information: [place] [address] [place] Hospital Course by problem list: Principal Problem:   Sepsis ([place]) Active Problems:   Small cell lung cancer in adult [facility])   Brain metastases ([place])   Hypoxia   Mr. Knack is a 61yo male with PMH of T2DM, HTN, chronic respiratory failure on 4L O2 [place] at home, and small cell lung cancer with metastases to the brain. Patient is under the care of the [place]; he presented to the [facility] ED with subjective fevers,  altered mental status, increased cough and sputum production and shortness of breath.   Acute on chronic respiratory failure: Patient presented with very mild leukocytosis, tachycardia, tachypnea and encephalopathy, with concern for post-obstructive pneumonia and was originally concerning for sepsis; he was started on vanc/cefepime in the ED and then continued on cefepime once admitted. Patient had hypoxia and increased work of breathing and was subsequently treated with mag sulfate, solumedrol, breathing treatments and was temporarily placed on BiPAP. He had significant improvement, and by the following morning was back to baseline use of his home oxygen at 4L [place]. Patient was observed for an additional day for subsequent decompensation, however he was able to maintain his baseline status. CTA imaging was reviewed with radiology and was not consistent pneumonia. He was discharged with duonebs, albuterol, decadron (discussed below), and azithromycin for full 5 day course.   Stage 4 small cell lung cancer with brain metastases: Patient with recently diagnosed metastatic [place] lung cancer in Nov 2017. He has been evaluated by rad onc and med onc and had one round of RT and chemo but these were discontinued due to side effects, lack of significant improvement, and low yield for improvement at his stage. Patient has been under some home hospice care, but family is reaching out for in home care they are reaching the edge of their ability to manage his symptoms. Our inpatient team and palliative care had ongoing discussions with family and HCPOA about goals of care. Patient was maintained as a FULL CODE. Our palliative care team reached out the hospice to help request in home sitters.   Encephalopathy: Patient at admission had confusion and has had an unstable gain and increased somnolence in the last 48hours prior to admission. CT head showed mild to mod obstructive hydrocephalus and cerebellar edema. Patient was  placed on decadron '4mg'$  QID initially and down to BID on discharge. He will follow up with PCP for discussion on goals of care and palliative RT vs continuing hospice.   T2DM: Patient not on home regimen and did not require much SSI while hospitalized despite steroids so insulin was d/c.  HTN: Patient's lisinopril was held while inpatient and pressures remained mildly hypertensive to 150s. He was continued on home regimen at discharge.  Discharge Vitals:   BP (!) 157/92 (BP Location: Left Arm)   Pulse (!) 104   Temp 97.5 F (36.4 C) (Axillary)   Resp 20   Ht 6' (1.829 m)   Wt 189 lb 6 oz (85.9 kg)   SpO2 98%   BMI 25.68 kg/m   Pertinent Labs, Studies, and Procedures:  CBC Latest Ref Rng & Units 05/03/2016 05/02/2016  WBC 4.0 - 10.5 K/uL 8.1 11.0(H)  Hemoglobin 13.0 - 17.0 g/dL 10.9(L) 11.7(L)  Hematocrit 39.0 - 52.0 % 33.6(L) 36.8(L)  Platelets 150 - 400 K/uL 251 267   CMP Latest Ref  Rng & Units 05/02/2016  Glucose 65 - 99 mg/dL 142(H)  BUN 6 - 20 mg/dL 9  Creatinine 0.61 - 1.24 mg/dL 0.68  Sodium 135 - 145 mmol/L 140  Potassium 3.5 - 5.1 mmol/L 3.7  Chloride 101 - 111 mmol/L 99(L)  CO2 22 - 32 mmol/L 32  Calcium 8.9 - 10.3 mg/dL 9.5  Total Protein 6.5 - 8.1 g/dL 7.7  Total Bilirubin 0.3 - 1.2 mg/dL 1.0  Alkaline Phos 38 - 126 U/L 66  AST 15 - 41 U/L 41  ALT 17 - 63 U/L 22   Influenza panel 05/02/2016: negative  BCx 05/02/2016 x2: NGTD x 3 days  CXR 05/02/2016:  Severe right middle and lower lobe consolidation  CTA: No PE Advanced stage IV lung cancer extending into the mediastinum, encroachment of right main and right upper lobe pulm arteries. Occlusion of right middle lobe bronchi with subsequent consolidation and collapse of dependent lung space. Mediastinal adenopathy, enlarged left supraclavicular lymph nodes, a left upper lobe nodule and left adrenal mass.   CT head: Mild to moderate obstructive hydrocephalus. Symmetric cerebellar edema  Discharge  Instructions: Discharge Instructions    Call MD for:  difficulty breathing, headache or visual disturbances    Complete by:  As directed    Call MD for:  extreme fatigue    Complete by:  As directed    Call MD for:  hives    Complete by:  As directed    Call MD for:  persistant dizziness or light-headedness    Complete by:  As directed    Call MD for:  persistant nausea and vomiting    Complete by:  As directed    Call MD for:  redness, tenderness, or signs of infection (pain, swelling, redness, odor or green/yellow discharge around incision site)    Complete by:  As directed    Call MD for:  severe uncontrolled pain    Complete by:  As directed    Call MD for:  temperature >100.4    Complete by:  As directed    Discharge instructions    Complete by:  As directed    Please take azithromycin starting tomorrow, one pill a day for 2 days.   Our palliative care colleagues have reached out to the [place] to request in home assistance with home hospice; continue working with hospice to ensure Mr. Fei comfort and safety at home. The social workers that work with hospice are a great help with getting more resources available to the patient; I would recommend working closely with them.  Continue discussing ultimate goals of care for Mr. Parson with him and the rest of the family.   If Mr. Lesser were to start looking like he is getting sick or something is out of the ordinary for him, calling hospice would be great because they can start him on different therapies and adjust his medications while he is at home.   If further hospitalizations are needed, please bring documentation of healthcare power of attorney, if filled out, and any advanced directive documentation; that will help in his management and ensure we are treating him according to his wishes if he does become altered. This is something we recommend for any patient that does have this documentation.   Increase activity slowly    Complete  by:  As directed       Signed: Alphonzo Grieve, MD 05/04/2016, 3:17 PM   Pager [phone]

## 2016-05-04 NOTE — Care Management Note (Addendum)
Case Management Note  Patient Details  Name: CLEMMIE MARXEN MRN: [id number] Date of Birth: 22-Oct-1955  Subjective/Objective:    Presents with sepsis, has small cell lung ca, brain mets, hypoxia, he is active with [facility] for Home Hospice, plan is to dc today back home with hospice, patient has DME at home already, he will travel by car per wife Darryll Capers.  Darryll Capers states their son will bring patient's oxygen tank for him to go home with.  NCM spoke with Baldo Ash RN with [facility] and she state to call the home office because she was at another clients home.  NCM notified the home office that patient will be dc today, Wilma, patient's wife states she will be with patient 24 hrs and also other family members, they will not have sitters from the [place].    will fax dc summary to [phone].                  Action/Plan:   Expected Discharge Date:                  Expected Discharge Plan:  Home w Hospice Care  In-House Referral:     Discharge planning Services  CM Consult  Post Acute Care Choice:  Resumption of Svcs/PTA Provider Choice offered to:  Spouse, Patient  DME Arranged:    DME Agency:     HH Arranged:  RN [place] Agency:     Status of Service:  Completed, signed off  If discussed at [place] of Stay Meetings, dates discussed:    Additional Comments:  Zenon Mayo, RN 05/04/2016, 2:38 PM

## 2016-05-04 NOTE — Progress Notes (Signed)
   Subjective:  Patient comfortable, he endorses some lower back pain for which he had just received morphine.   We discussed with significant other plans at discharge. She states he will go home with hospice if not requiring further medical treatment and transfer to the [place]. She states patient has not been seen by onc or rad onc since failed initial treatment. She states that most of patient's care has been through the hospice provider and not patient's PCP.   Objective:  Vital signs in last 24 hours: Vitals:   05/04/16 0800 05/04/16 0811 05/04/16 0841 05/04/16 1143  BP:   (!) 156/86   Pulse: 90  91   Resp: 16  19   Temp:   98.7 F (37.1 C)   TempSrc:   Oral   SpO2:  96% 99% 96%  Weight:      Height:       Constitutional: NAD, pleasantly confused, resting comfortably CV: RRR, systolic ejection murmur, no gallops or rubs; pulses intact, no LE edema Resp: decreased breath sounds on RL region; no increased work of breathing Abd: soft, NDNT Neuro: A&O to self, CN 2-12 grossly intact  Assessment/Plan:  Principal Problem:   Sepsis ([place]) Active Problems:   Small cell lung cancer in adult [facility])   Brain metastases ([place])   Hypoxia  Sepsis: Resolved. He was flu negative, and appears to be improving well with treatment for COPD and pneumonia. --f/u BCx NGTD  Encephalopathy: Resolved; 2/2 sepsis vs hydrocephalus/cerebral edema. Patient appears to be at baseline orientation. He is pleasantly confused this morning, joking with the team and following commands appropriately.  --decadron; will d/c with '4mg'$  BID.  Acute on chronic respiratory failure: Patient back on his home oxygen of 4L [place], with improvement of symptoms and exam. Patient with significant improvement with COPD management and imaging not too convincing for pneumonia.  --home O2 4L [place]; additional if SpO2 <88% --CPAP qhs --decadron --azithromycin on d/c --duonebs; albuterol PRN  Stage 4 small cell lung cancer, goals of  care: Wife in room this AM; they state they are set up with hospice.There continues to be disconnect in goals of care as well as who is primary. Per significant other, patient is no longer being followed by rad onc or oncology, though at least she wants to continue pursuing treatment. They plan to continue following up with the [place] providers for further primary care management and other care if they decide to pursue other treatments/palliative options; we will not be pursuing these during this hospitalization. Greatly appreciate palliative care reaching out to patient's hospice.  --morphine '4mg'$  IV q4hrs PRN; will adjust as necessary --senokot-s  --zofran PRN --d/c on home regimen  T2DM: He is not requiring much insulin (5 in last 24hrs) even with steroids. Will not require medication on d/c at this time.  Dispo: Anticipated discharge today.   Alphonzo Grieve, MD 05/04/2016, 11:50 AM Pager [phone]

## 2016-05-04 NOTE — Progress Notes (Addendum)
Palliative Medicine RN Note:   0930 Attempted to call [place] contact RN Baldo Ash [phone]; no answer, no option to leave voicemail. Will continue to try to reach her. Unclear if pt is active with hospice at this time. Will update this note as the day progresses.  [place] number above; it is not correct and NOT the [place]. Called [phone], as area code 919 is [place]. This number is for [facility] with [facility] out of [place]. Pt is active with them, and they are aware of admission. Baldo Ash reports that she has been talking to pt and family about him not being able to stay at home d/t needing increased care, and about the possibility of going to either [place] hospice home or SNF with [facility]; she is concerned about safety at home. She also reports that Darryll Capers is not his wife but his significant other/HCPOA. [place] doesn't know if [facility] Morel has a copy of the HCPOA. She gave me the phone number for pt's hospice [facility]. I called and spoke with Devanna. She states that pt has been refusing her visits and that he has remained adamant about being a full code. She has not seen a copy of his POA. She does not think pt is service connected. Devanna also states all of pt's services have been through the hospice provider and not directly through [place]. Attending note from yesterday states pt may be approached about palliative radiation; this will likely require the pt to revoke hospice, as most hospices cannot accommodate such an expensive therapy. PMT will place order for RNCM to contact [place] about sitters; I expect that family will have to be turned down for that in order to consider facility placement.  Marjie Skiff Zaelyn Noack, RN, BSN, [facility] 05/04/2016 9:58 AM Cell [phone] 8:00-4:00 Monday-Friday Office [phone]

## 2016-05-04 NOTE — Progress Notes (Signed)
Patient being discharged to home. IV has been removed and discharge instructions have been reviewed. Son is bringing oxygen tank from home for transport. All personal items are accounted for and int he possession of family. No questions or concerns at this time.

## 2016-05-07 LAB — CULTURE, BLOOD (ROUTINE X 2)
CULTURE: NO GROWTH
Culture: NO GROWTH

## 2016-05-12 ENCOUNTER — Other Ambulatory Visit: Payer: Self-pay | Admitting: Internal Medicine

## 2016-05-13 NOTE — Telephone Encounter (Signed)
That is correct; and the azithromycin was for short course which was provided at discharge for his COPD exacerbation. If family or patient called and are concerned for further exacerbation or pneumonia, please let them know to speak to their hospice provider or PCP as he likely needs to be evaluated. Thank you.

## 2016-06-07 DEATH — deceased

## 2018-05-22 IMAGING — CT CT HEAD W/O CM
3 of 4 series · 17 of 47 positions shown, 20 images · non-contrast
Comparison: None.

CLINICAL DATA: Altered mental status. Respiratory distress. History
of metastatic lung carcinoma.

EXAM:
CT HEAD WITHOUT CONTRAST
TECHNIQUE: Contiguous axial images were obtained from the base of the skull
through the vertex without intravenous contrast.

[Series 201: head w/o, idose (1) · axial · non-contrast · 0.45mm/px · z∈[+10,+135]mm · 11 of 31 slices shown, 14 images]
[im 3/31  brain]
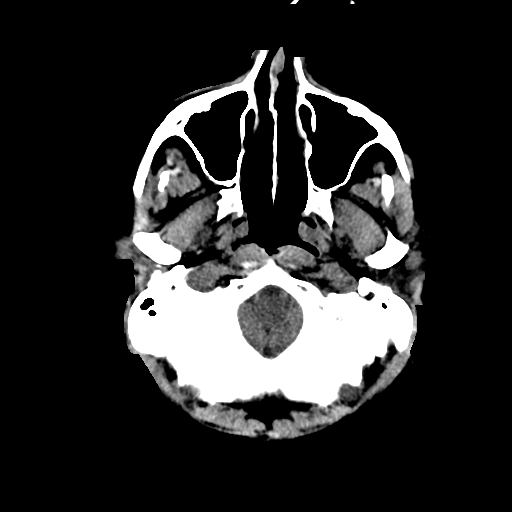
[im 3/31  bone]
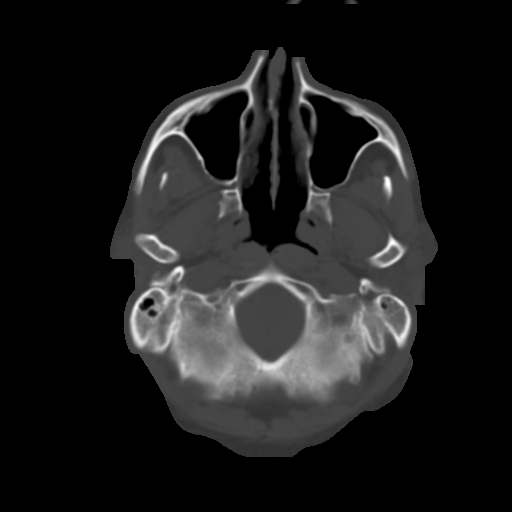
[im 5/31  brain]
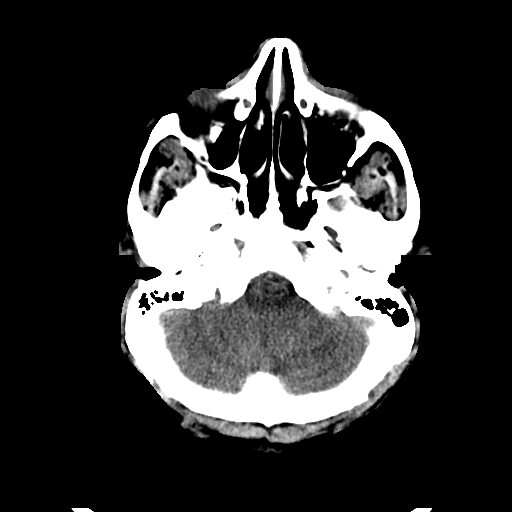
[im 7/31  brain]
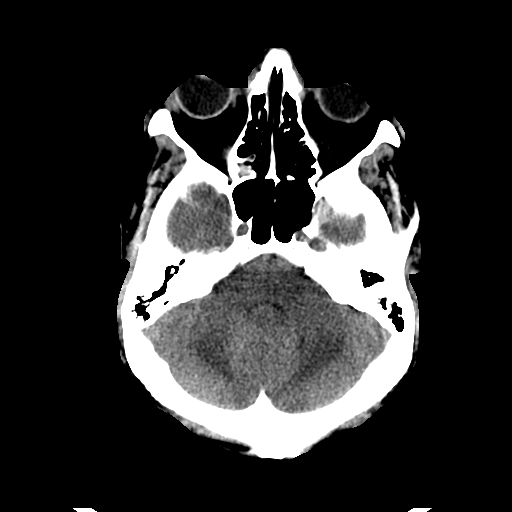
[im 11/31  brain]
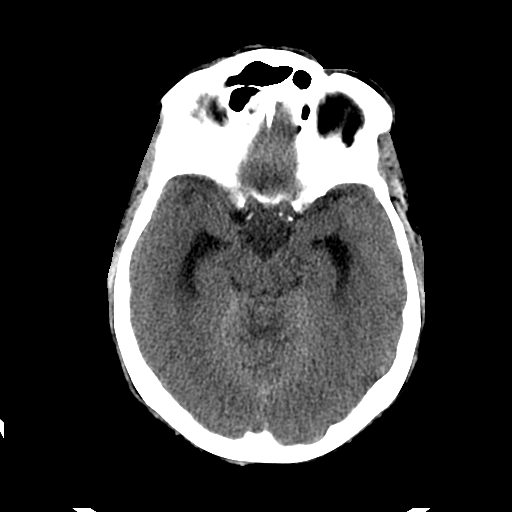
[im 13/31  brain]
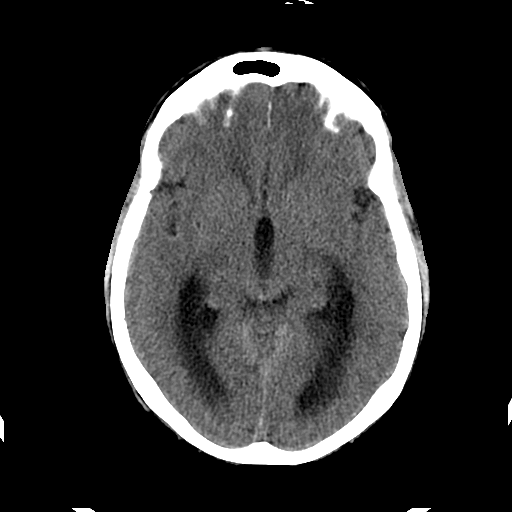
[im 13/31  bone]
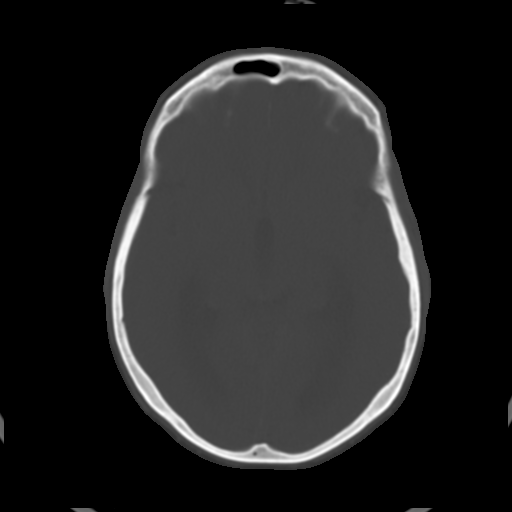
[im 16/31  brain]
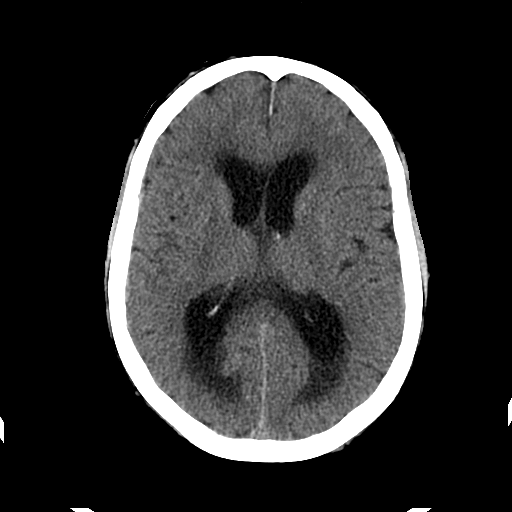
[im 18/31  brain]
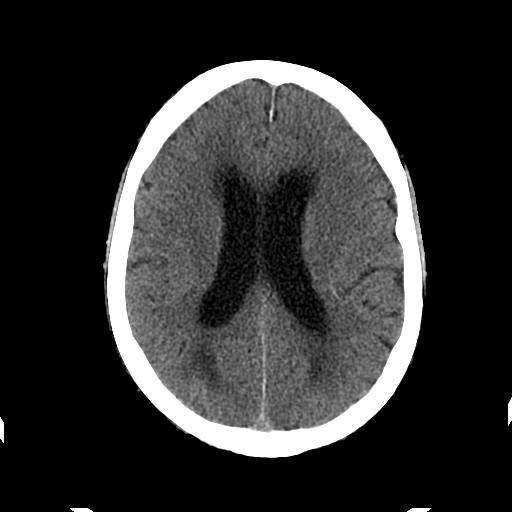
[im 20/31  brain]
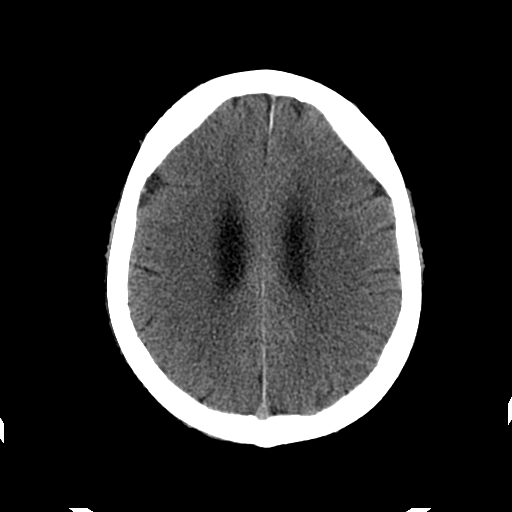
[im 24/31  brain]
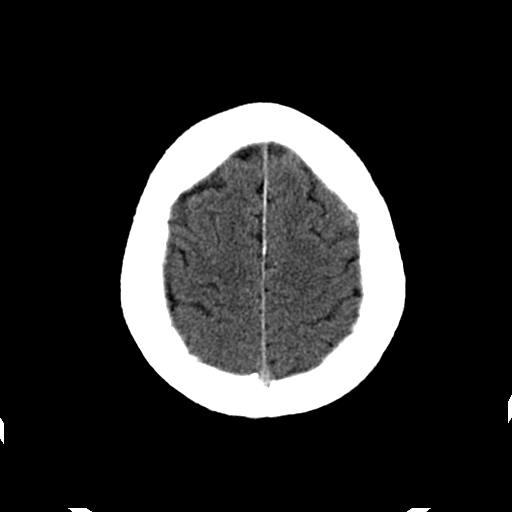
[im 24/31  bone]
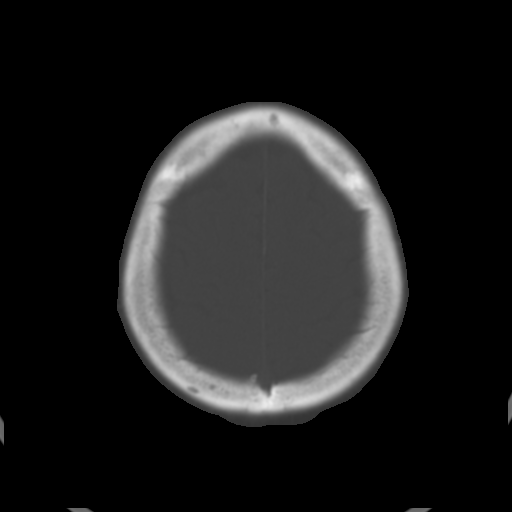
[im 26/31  brain]
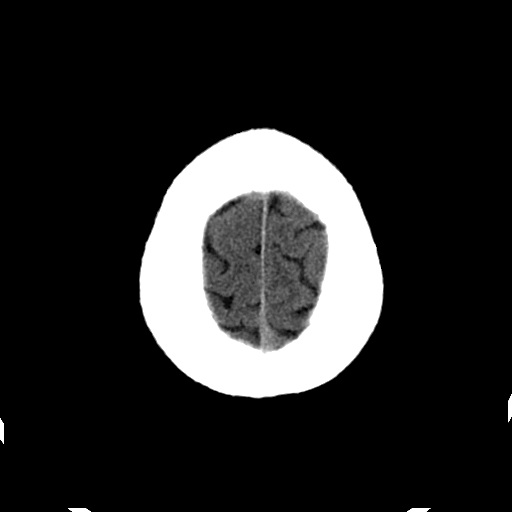
[im 28/31  brain]
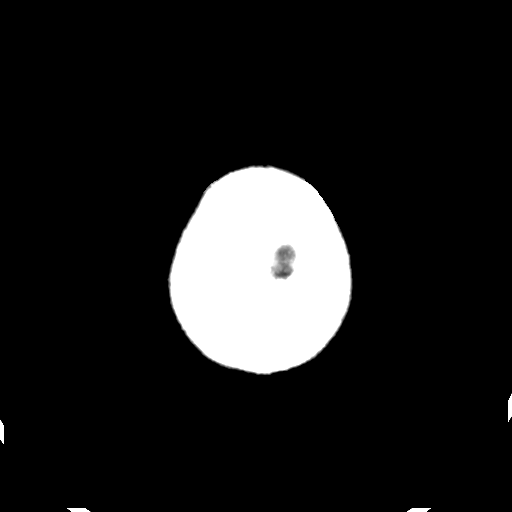

[Series 203: coronal st, idose (1) · coronal · 0.40mm/px · 3 of 76 slices shown]
[im 26/76  brain]
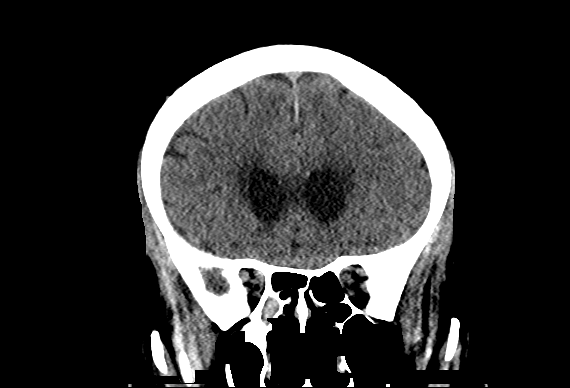
[im 34/76  brain]
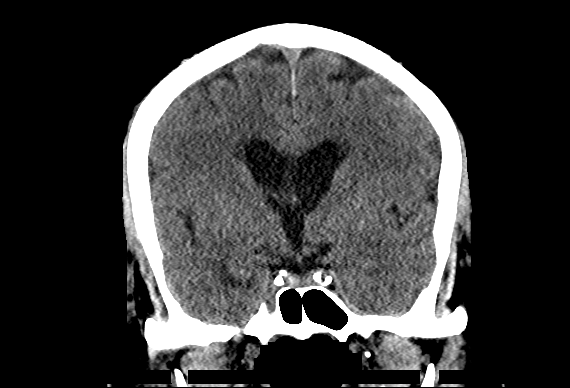
[im 42/76  brain]
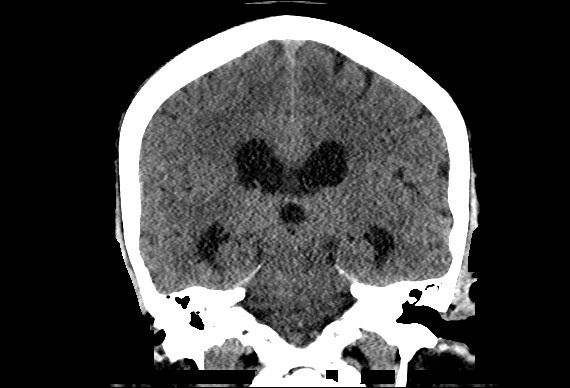

[Series 204: sagittal st, idose (1) · sagittal · 0.40mm/px · 3 of 76 slices shown]
[im 26/76  brain]
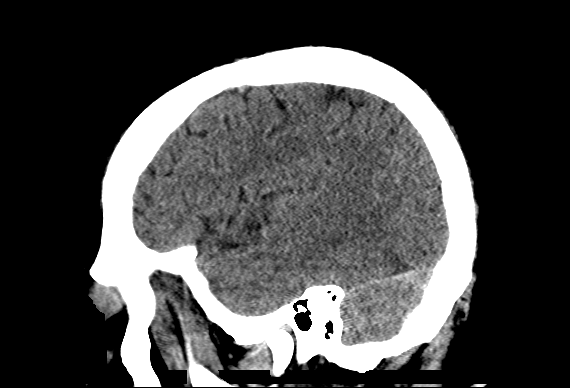
[im 38/76  brain]
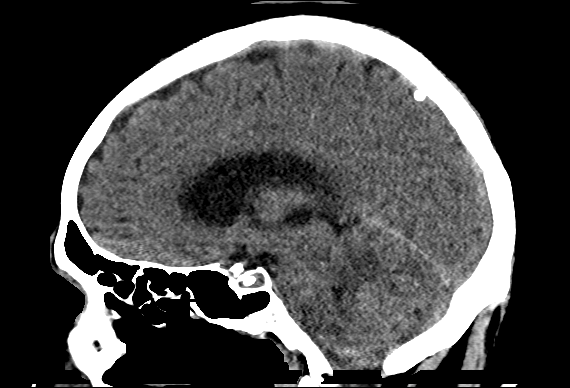
[im 51/76  brain]
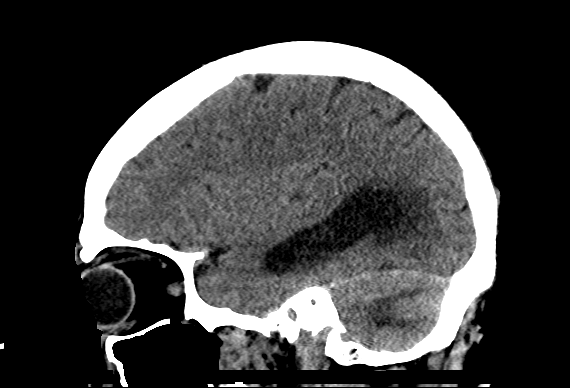

[17 of 47 positions shown; findings below may reference images not displayed]

FINDINGS: Brain: No evidence of intracranial hemorrhage, abnormal extra axial
fluid collections, or midline shift.

Moderate dilatation of the lateral and third ventricles is seen. The
fourth ventricle is small in size, and symmetric decreased
attenuation within the cerebellum bilaterally is suspicious for
edema. This is of uncertain etiology, underlying metastatic disease
cannot be excluded.

Vascular: No hyperdense vessel or unexpected calcification.

Skull: Normal. Negative for fracture or focal lesion.

Sinuses/Orbits: No acute finding.

Other: None.
IMPRESSION: Mild to moderate obstructive hydrocephalus. Symmetric bilateral
cerebellar edema, which is of uncertain etiology. Underlying
metastatic disease cannot be excluded. Recommend brain MRI without
and with contrast for further evaluation.
